# Patient Record
Sex: Male | Born: 1985 | Race: White | Hispanic: No | Marital: Single | Smoking: Former smoker
Health system: Southern US, Community
[De-identification: ages and names within clinical notes are randomized; demographics above are authoritative.]

## PROBLEM LIST (undated history)

## (undated) HISTORY — PX: HERNIA REPAIR: SHX51

---
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
## 2018-07-09 ENCOUNTER — Other Ambulatory Visit: Payer: Self-pay

## 2018-07-09 ENCOUNTER — Emergency Department
Admission: EM | Admit: 2018-07-09 | Discharge: 2018-07-09 | Disposition: A | Discharge status: Home / Self Care | Payer: Self-pay | Attending: Emergency Medicine | Admitting: Emergency Medicine

## 2018-07-09 ENCOUNTER — Emergency Department: Payer: Self-pay

## 2018-07-09 ENCOUNTER — Encounter: Payer: Self-pay | Admitting: Emergency Medicine

## 2018-07-09 DIAGNOSIS — F17210 Nicotine dependence, cigarettes, uncomplicated: Secondary | ICD-10-CM | POA: Insufficient documentation

## 2018-07-09 DIAGNOSIS — J40 Bronchitis, not specified as acute or chronic: Secondary | ICD-10-CM | POA: Insufficient documentation

## 2018-07-09 MED ORDER — PSEUDOEPH-BROMPHEN-DM 30-2-10 MG/5ML PO SYRP: 10.0000 mL | ORAL_SOLUTION | Freq: Four times a day (QID) | ORAL | 0 refills | Status: DC | PRN

## 2018-07-09 MED ORDER — PREDNISONE 50 MG PO TABS: 50.0000 mg | ORAL_TABLET | Freq: Every day | ORAL | 0 refills | Status: DC

## 2018-07-09 MED ORDER — ALBUTEROL SULFATE HFA 108 (90 BASE) MCG/ACT IN AERS: 2.0000 | INHALATION_SPRAY | RESPIRATORY_TRACT | 0 refills | Status: DC | PRN

## 2018-07-09 NOTE — ED Provider Notes (Signed)
[facility] Emergency Department Provider Note  ____________________________________________  Time seen: Approximately 3:48 PM  I have reviewed the triage vital signs and the nursing notes.   HISTORY  Chief Complaint No chief complaint on file.    HPI Allen Durham is a 32 y.o. male who presents the emergency department complaining of shortness of breath, cough,  "rattling" in his chest with breathing.  Patient reports that he has had intermittent fevers, pain with chills.  Patient reports his symptoms are worse at night.  He denies any nasal congestion, ear pain, sore throat, headache, neck stiffness, chest pain, dental pain, nausea vomiting.  No history of asthma.  No medications for his complaint prior to arrival.   History reviewed. No pertinent past medical history.  There are no active problems to display for this patient.   Past Surgical History:  Procedure Laterality Date  . HERNIA REPAIR      Prior to Admission medications   Medication Sig Start Date End Date Taking? Authorizing Provider  albuterol (PROVENTIL HFA;VENTOLIN HFA) 108 (90 Base) MCG/ACT inhaler Inhale 2 puffs into the lungs every 4 (four) hours as needed for wheezing or shortness of breath. 07/09/18   Mattalynn Crandle, Delorise RoyalsJonathan D, PA-C  brompheniramine-pseudoephedrine-DM 30-2-10 MG/5ML syrup Take 10 mLs by mouth 4 (four) times daily as needed. 07/09/18   Cinthya Bors, Delorise RoyalsJonathan D, PA-C  predniSONE (DELTASONE) 50 MG tablet Take 1 tablet (50 mg total) by mouth daily with breakfast. 07/09/18   Wealthy Danielski, Delorise RoyalsJonathan D, PA-C    Allergies Amoxapine and related  No family history on file.  Social History Social History   Tobacco Use  . Smoking status: Current Every Day Smoker    Types: Cigarettes  . Smokeless tobacco: Never Used  Substance Use Topics  . Alcohol use: Not on file  . Drug use: Not on file     Review of Systems  Constitutional: No fever/chills Eyes: No visual changes.  ENT:  No upper respiratory complaints. Cardiovascular: no chest pain. Respiratory: Positive cough. No SOB.  Positive for "rattling" when breathing Gastrointestinal: No abdominal pain.  No nausea, no vomiting.  No diarrhea.  No constipation. Musculoskeletal: Negative for musculoskeletal pain. Skin: Negative for rash, abrasions, lacerations, ecchymosis. Neurological: Negative for headaches, focal weakness or numbness. 10-point ROS otherwise negative.  ____________________________________________   PHYSICAL EXAM:  VITAL SIGNS: ED Triage Vitals  Enc Vitals Group     BP 07/09/18 1358 129/82     Pulse Rate 07/09/18 1358 (!) 104     Resp 07/09/18 1358 (!) 23     Temp 07/09/18 1358 98.5 F (36.9 C)     Temp Source 07/09/18 1358 Oral     SpO2 07/09/18 1358 99 %     Weight 07/09/18 1359 220 lb (99.8 kg)     Height 07/09/18 1359 6\' 1"  (1.854 m)     Head Circumference --      Peak Flow --      Pain Score 07/09/18 1400 8     Pain Loc --      Pain Edu? --      Excl. in [place]? --      Constitutional: Alert and oriented. Well appearing and in no acute distress. Eyes: Conjunctivae are normal. PERRL. EOMI. Head: Atraumatic. ENT:      Ears: EACs and TMs unremarkable bilaterally      Nose: No congestion/rhinnorhea.      Mouth/Throat: Mucous membranes are moist.  Oropharynx is minimally erythematous.  Not edematous. Neck: No stridor.  Neck is supple full range of motion Hematological/Lymphatic/Immunilogical: No cervical lymphadenopathy. Cardiovascular: Normal rate, regular rhythm. Normal S1 and S2.  Good peripheral circulation. Respiratory: Normal respiratory effort without tachypnea or retractions. Lungs with rhonchi bilaterally, worse on the right than left.  Adventitious lung sounds are appreciated in bilateral lower lung fields.Peri [place]. Good air entry to the bases with no decreased or absent breath sounds. Musculoskeletal: Full range of motion to all extremities. No gross deformities  appreciated. Neurologic:  Normal speech and language. No gross focal neurologic deficits are appreciated.  Skin:  Skin is warm, dry and intact. No rash noted. Psychiatric: Mood and affect are normal. Speech and behavior are normal. Patient exhibits appropriate insight and judgement.   ____________________________________________   LABS (all labs ordered are listed, but only abnormal results are displayed)  Labs Reviewed - No data to display ____________________________________________  EKG   ____________________________________________  RADIOLOGY I personally viewed and evaluated these images as part of my medical decision making, as well as reviewing the written report by the radiologist.  I concur with radiologist finding of no consolidation concerning for pneumonia.  Dg Chest 2 View  Result Date: 07/09/2018 CLINICAL DATA:  Wheezing and chills for the past 3 days. EXAM: CHEST - 2 VIEW COMPARISON:  Chest x-ray dated August 19, 2006. FINDINGS: The heart size and mediastinal contours are within normal limits. Both lungs are clear. The visualized skeletal structures are unremarkable. IMPRESSION: No active cardiopulmonary disease. Electronically Signed   By: Obie DredgeWilliam T Derry M.D.   On: 07/09/2018 16:05    ____________________________________________    PROCEDURES  Procedure(s) performed:    Procedures    Medications - No data to display   ____________________________________________   INITIAL IMPRESSION / ASSESSMENT AND PLAN / ED COURSE  Pertinent labs & imaging results that were available during my care of the patient were reviewed by me and considered in my medical decision making (see chart for details).  Review of the [place] CSRS was performed in accordance of the [facility] prior to dispensing any controlled drugs.      Patient's diagnosis is consistent with bronchitis.  Patient presents emergency department with several day history of wheeze, "rattling", cough.  On  exam, mild rhonchi bilateral lower lung fields.  Chest x-ray reveals no consolidation concerning for pneumonia.  Differential pharyngeal includes viral URI, bronchitis, pneumonia.  Patient will be prescribed albuterol, prednisone, cough medication.  Follow-up with primary care as needed..  Patient is given ED precautions to return to the ED for any worsening or new symptoms.     ____________________________________________  FINAL CLINICAL IMPRESSION(S) / ED DIAGNOSES  Final diagnoses:  Bronchitis      NEW MEDICATIONS STARTED DURING THIS VISIT:  ED Discharge Orders         Ordered    albuterol (PROVENTIL HFA;VENTOLIN HFA) 108 (90 Base) MCG/ACT inhaler  Every 4 hours PRN     07/09/18 1624    predniSONE (DELTASONE) 50 MG tablet  Daily with breakfast     07/09/18 1624    brompheniramine-pseudoephedrine-DM 30-2-10 MG/5ML syrup  4 times daily PRN     07/09/18 1624              This chart was dictated using voice recognition software/Dragon. Despite best efforts to proofread, errors can occur which can change the meaning. Any change was purely unintentional.    Racheal PatchesCuthriell, Krisalyn Yankowski D, PA-C 07/09/18 1625    Sharman CheekStafford, Phillip, MD 07/20/18 [phone]

## 2018-07-09 NOTE — ED Notes (Signed)
See triage note  Presents with cough and occasional wheezing for the past couple of days  Unsure of fever but has had hot flashes and chills  Afebrile on arrival

## 2018-07-09 NOTE — ED Triage Notes (Signed)
C/O wheezing, chills x 3 days.  Has not taken any medication for symptoms.

## 2019-01-26 ENCOUNTER — Emergency Department: Payer: Self-pay

## 2019-01-26 ENCOUNTER — Encounter: Payer: Self-pay | Admitting: Emergency Medicine

## 2019-01-26 ENCOUNTER — Other Ambulatory Visit: Payer: Self-pay

## 2019-01-26 ENCOUNTER — Emergency Department
Admission: EM | Admit: 2019-01-26 | Discharge: 2019-01-26 | Disposition: A | Discharge status: Home / Self Care | Payer: Self-pay | Attending: Emergency Medicine | Admitting: Emergency Medicine

## 2019-01-26 DIAGNOSIS — Y999 Unspecified external cause status: Secondary | ICD-10-CM | POA: Insufficient documentation

## 2019-01-26 DIAGNOSIS — S62317A Displaced fracture of base of fifth metacarpal bone. left hand, initial encounter for closed fracture: Secondary | ICD-10-CM | POA: Insufficient documentation

## 2019-01-26 DIAGNOSIS — W010XXA Fall on same level from slipping, tripping and stumbling without subsequent striking against object, initial encounter: Secondary | ICD-10-CM | POA: Insufficient documentation

## 2019-01-26 DIAGNOSIS — F17210 Nicotine dependence, cigarettes, uncomplicated: Secondary | ICD-10-CM | POA: Insufficient documentation

## 2019-01-26 DIAGNOSIS — Y939 Activity, unspecified: Secondary | ICD-10-CM | POA: Insufficient documentation

## 2019-01-26 DIAGNOSIS — Y92017 Garden or yard in single-family (private) house as the place of occurrence of the external cause: Secondary | ICD-10-CM | POA: Insufficient documentation

## 2019-01-26 NOTE — ED Triage Notes (Signed)
Tripped and fell today while doing yard work, c/o left hand pain

## 2019-01-26 NOTE — Discharge Instructions (Signed)
Your x-ray shows that you have a fracture to the fifth bone in your hand.  Please wear splint and use sling.  Please follow-up with hand surgery or orthopedics in the next week for reevaluation.  Ice and elevate hand tonight.  You can take Motrin for pain and swelling.

## 2019-01-26 NOTE — ED Provider Notes (Signed)
[facility] Emergency Department Provider Note  ____________________________________________  Time seen: Approximately 6:38 PM  I have reviewed the triage vital signs and the nursing notes.   HISTORY  Chief Complaint Hand Injury    HPI Allen Durham is a 33 y.o. male presents emergency department for evaluation of left hand pain after injury today.  Patient states that he was doing yard work, getting near ready for his son's birthday party when he tripped over the dog and his hand hit a wall.  No additional injuries.  Patient is left-handed.  No numbness, tingling.  History reviewed. No pertinent past medical history.  There are no active problems to display for this patient.   Past Surgical History:  Procedure Laterality Date  . HERNIA REPAIR      Prior to Admission medications   Not on File    Allergies Amoxapine and related  No family history on file.  Social History Social History   Tobacco Use  . Smoking status: Current Every Day Smoker    Types: Cigarettes  . Smokeless tobacco: Never Used  Substance Use Topics  . Alcohol use: Not on file  . Drug use: Not on file     Review of Systems  Respiratory:No SOB. Gastrointestinal: No abdominal pain.  No nausea, no vomiting.  Musculoskeletal: Positive for hand pain. Skin: Negative for rash, abrasions, lacerations, ecchymosis. Neurological: Negative for numbness or tingling   ____________________________________________   PHYSICAL EXAM:  VITAL SIGNS: ED Triage Vitals  Enc Vitals Group     BP 01/26/19 1748 125/82     Pulse Rate 01/26/19 1748 90     Resp 01/26/19 1748 16     Temp 01/26/19 1748 98.3 F (36.8 C)     Temp Source 01/26/19 1748 Oral     SpO2 01/26/19 1748 100 %     Weight 01/26/19 1744 220 lb 0.3 oz (99.8 kg)     Height --      Head Circumference --      Peak Flow --      Pain Score 01/26/19 1744 7     Pain Loc --      Pain Edu? --      Excl. in [place]? --       Constitutional: Alert and oriented. Well appearing and in no acute distress. Eyes: Conjunctivae are normal. PERRL. EOMI. Head: Atraumatic. ENT:      Ears:      Nose: No congestion/rhinnorhea.      Mouth/Throat: Mucous membranes are moist.  Neck: No stridor. Cardiovascular: Normal rate, regular rhythm.  Good peripheral circulation.  Symmetric radial pulses bilaterally.  Cap refill less than 3 seconds. Respiratory: Normal respiratory effort without tachypnea or retractions. Lungs CTAB. Good air entry to the bases with no decreased or absent breath sounds. Musculoskeletal: Full range of motion to all extremities. No gross deformities appreciated.  Tenderness to palpation with mild swelling to left fifth metacarpal.  Full range of motion of hand. Neurologic:  Normal speech and language. No gross focal neurologic deficits are appreciated.  Skin:  Skin is warm, dry and intact. No rash noted. Psychiatric: Mood and affect are normal. Speech and behavior are normal. Patient exhibits appropriate insight and judgement.   ____________________________________________   LABS (all labs ordered are listed, but only abnormal results are displayed)  Labs Reviewed - No data to display ____________________________________________  EKG   ____________________________________________  RADIOLOGY Robinette Haines, personally viewed and evaluated these images (plain radiographs) as part of  my medical decision making, as well as reviewing the written report by the radiologist.  Dg Hand Complete Left  Result Date: 01/26/2019 CLINICAL DATA:  Left hand pain in the region of the 5th metacarpal following a fall today. EXAM: LEFT HAND - COMPLETE 3+ VIEW COMPARISON:  None. FINDINGS: Fracture of the base of the 5th metacarpal with mild dorsal displacement of the distal fragment without significant angulation. Associated soft tissue swelling. IMPRESSION: Fracture of the base of the 5th metacarpal.  Electronically Signed   By: Beckie SaltsSteven  Reid M.D.   On: 01/26/2019 18:25    ____________________________________________    PROCEDURES  Procedure(s) performed:    Procedures    Medications - No data to display   ____________________________________________   INITIAL IMPRESSION / ASSESSMENT AND PLAN / ED COURSE  Pertinent labs & imaging results that were available during my care of the patient were reviewed by me and considered in my medical decision making (see chart for details).  Review of the [place] CSRS was performed in accordance of the [facility] prior to dispensing any controlled drugs.     Patient's diagnosis is consistent with fifth metacarpal fracture.  Vital signs and exam are reassuring.  X-ray consistent with fracture.  Ulnar gutter splint was placed.  Sling was given.  Patient will take Motrin for pain and swelling.  He declines prescription.  Patient is to follow up with Ortho or hand specialist as directed. Patient is given ED precautions to return to the ED for any worsening or new symptoms.   Allen RaidJesse Andrada was evaluated in Emergency Department on 01/26/2019 for the symptoms described in the history of present illness. He was evaluated in the context of the global COVID-19 pandemic, which necessitated consideration that the patient might be at risk for infection with the SARS-CoV-2 virus that causes COVID-19. Institutional protocols and algorithms that pertain to the evaluation of patients at risk for COVID-19 are in a state of rapid change based on information released by regulatory bodies including the CDC and federal and state organizations. These policies and algorithms were followed during the patient's care in the ED.  ____________________________________________  FINAL CLINICAL IMPRESSION(S) / ED DIAGNOSES  Final diagnoses:  Closed displaced fracture of base of fifth metacarpal bone of left hand, initial encounter      NEW MEDICATIONS STARTED DURING THIS  VISIT:  ED Discharge Orders    None          This chart was dictated using voice recognition software/Dragon. Despite best efforts to proofread, errors can occur which can change the meaning. Any change was purely unintentional.    Enid DerryWagner, Katrinka Herbison, PA-C 01/26/19 1853    Arnaldo NatalMalinda, Paul F, MD 01/26/19 2015

## 2019-01-26 NOTE — ED Notes (Signed)
See triage note  States he tripped over the dog  Hit a wall  Pain and swelling noted to left hand

## 2020-03-10 IMAGING — CR DG CHEST 2V
1 series · 2 of 2 positions shown · non-contrast
Comparison: Chest x-ray dated August 19, 2006.

CLINICAL DATA: Wheezing and chills for the past 3 days.

EXAM:
CHEST - 2 VIEW

[Series 1: dg chest 2 view · 0.14mm/px · 2 of 2 slices shown]
[im 1/2]
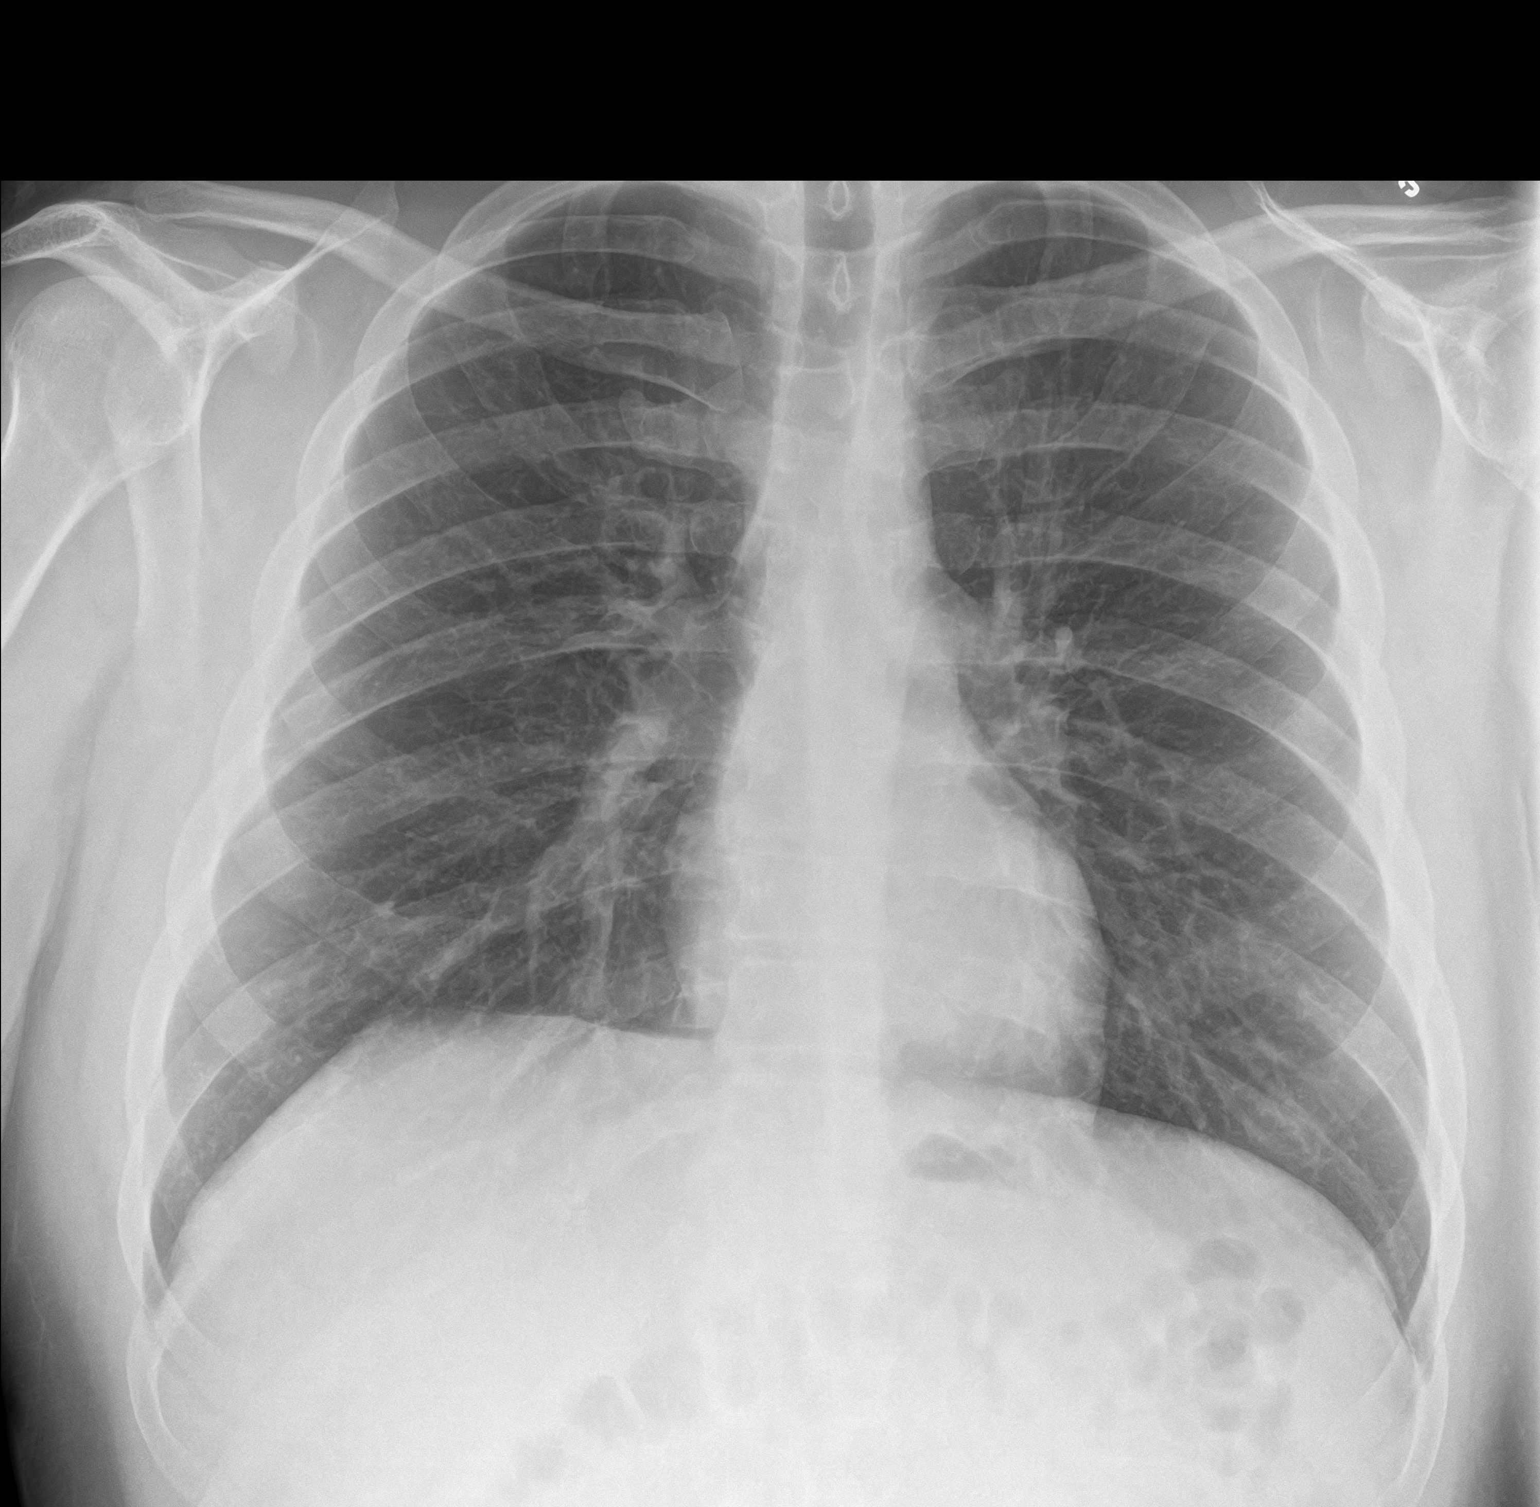
[im 2/2]
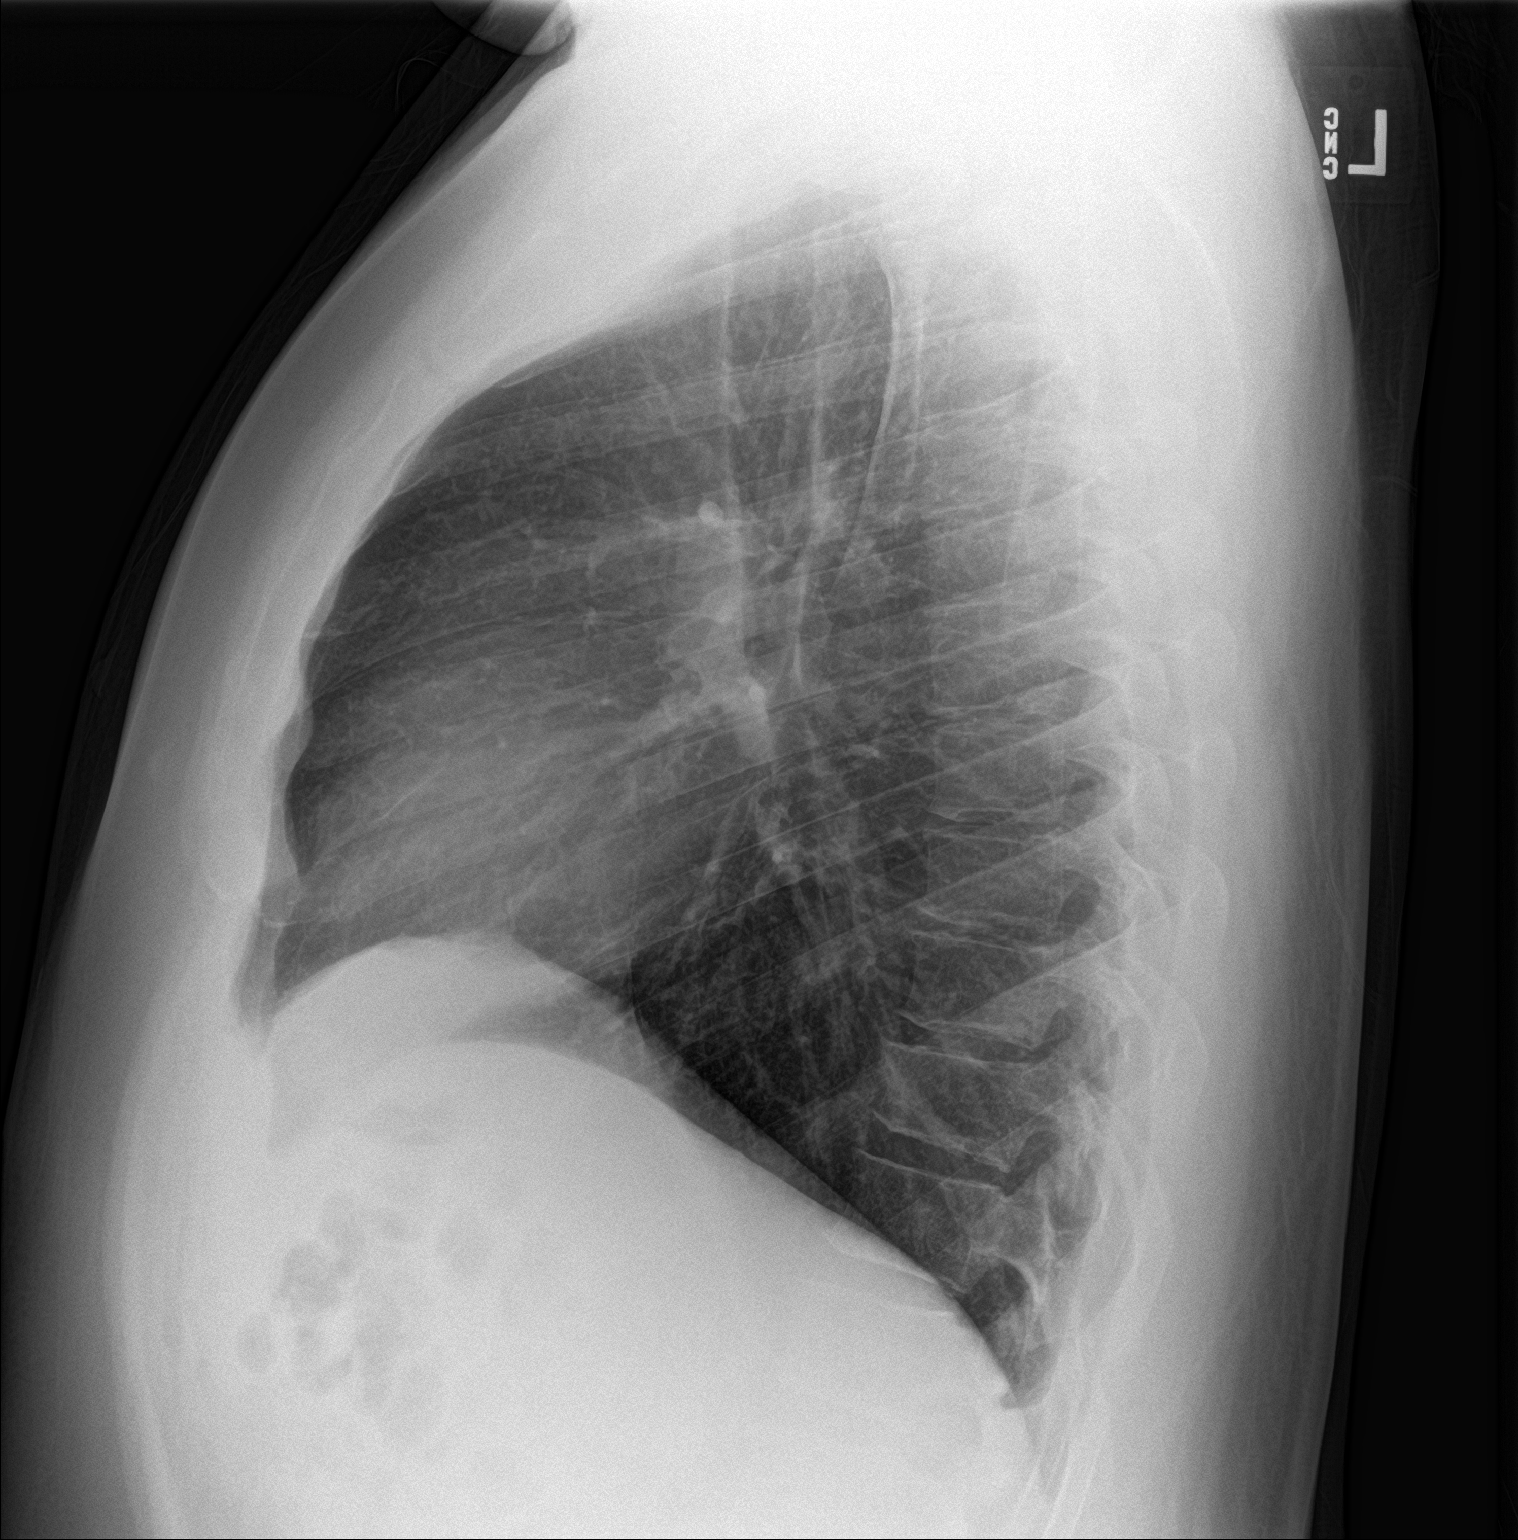

[2 of 2 positions shown; findings below may reference images not displayed]

FINDINGS: The heart size and mediastinal contours are within normal limits.
Both lungs are clear. The visualized skeletal structures are
unremarkable.
IMPRESSION: No active cardiopulmonary disease.

## 2020-09-27 IMAGING — CR LEFT HAND - COMPLETE 3+ VIEW
3 series · 3 of 3 positions shown · non-contrast
Comparison: None.

CLINICAL DATA: Left hand pain in the region of the 5th metacarpal
following a fall today.

EXAM:
LEFT HAND - COMPLETE 3+ VIEW

[hand ap]
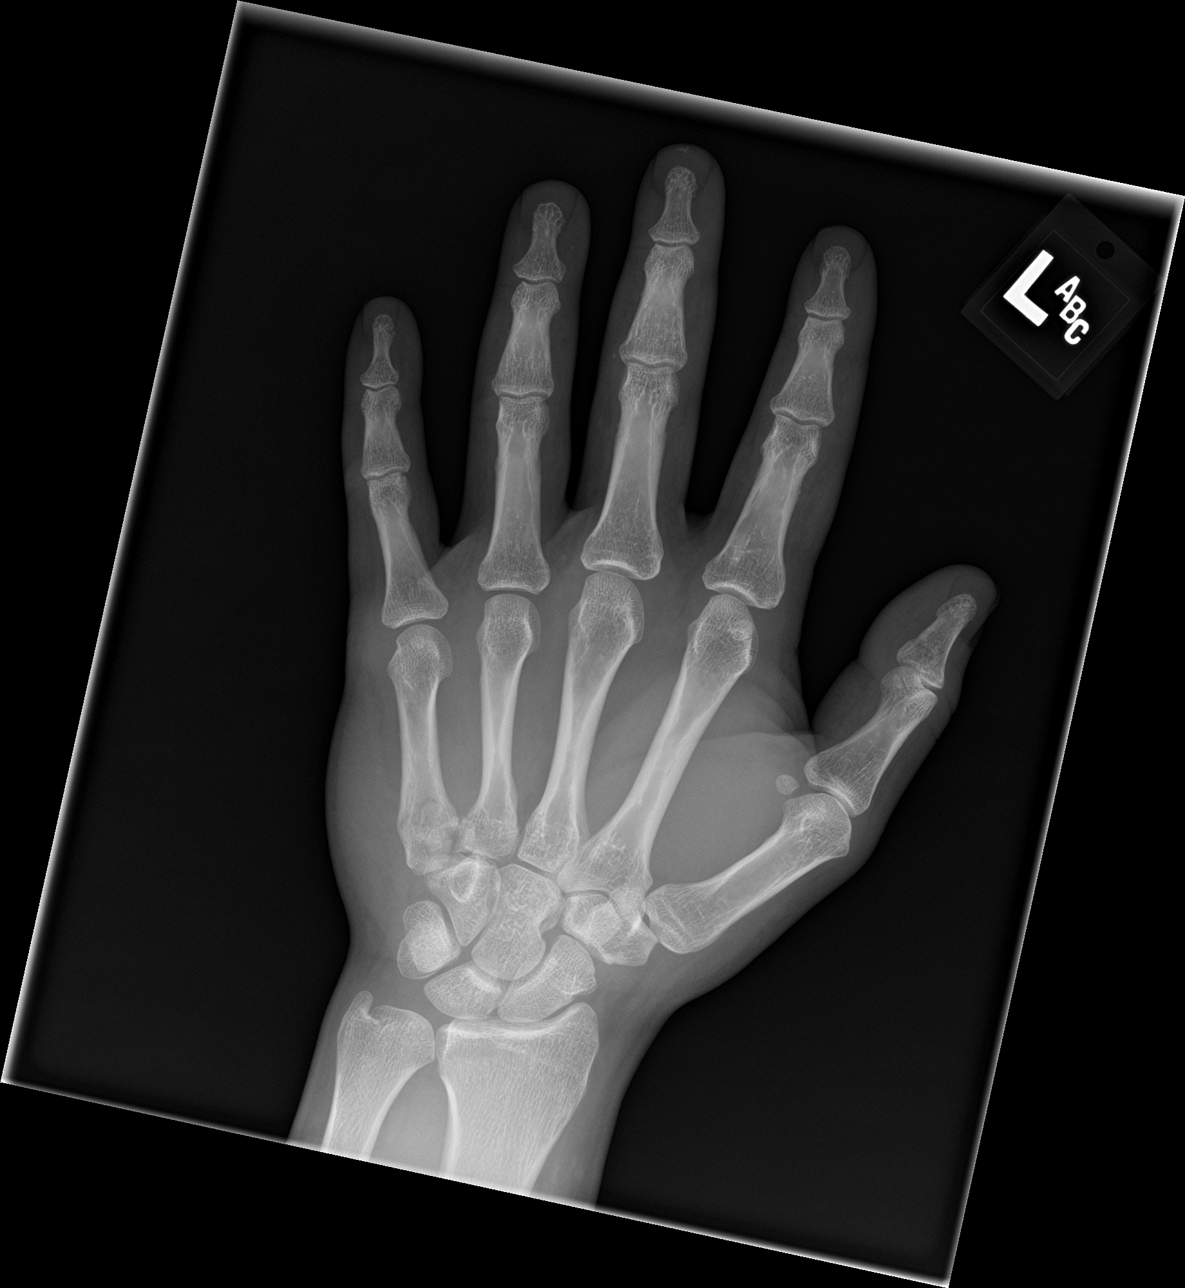

[hand obl]
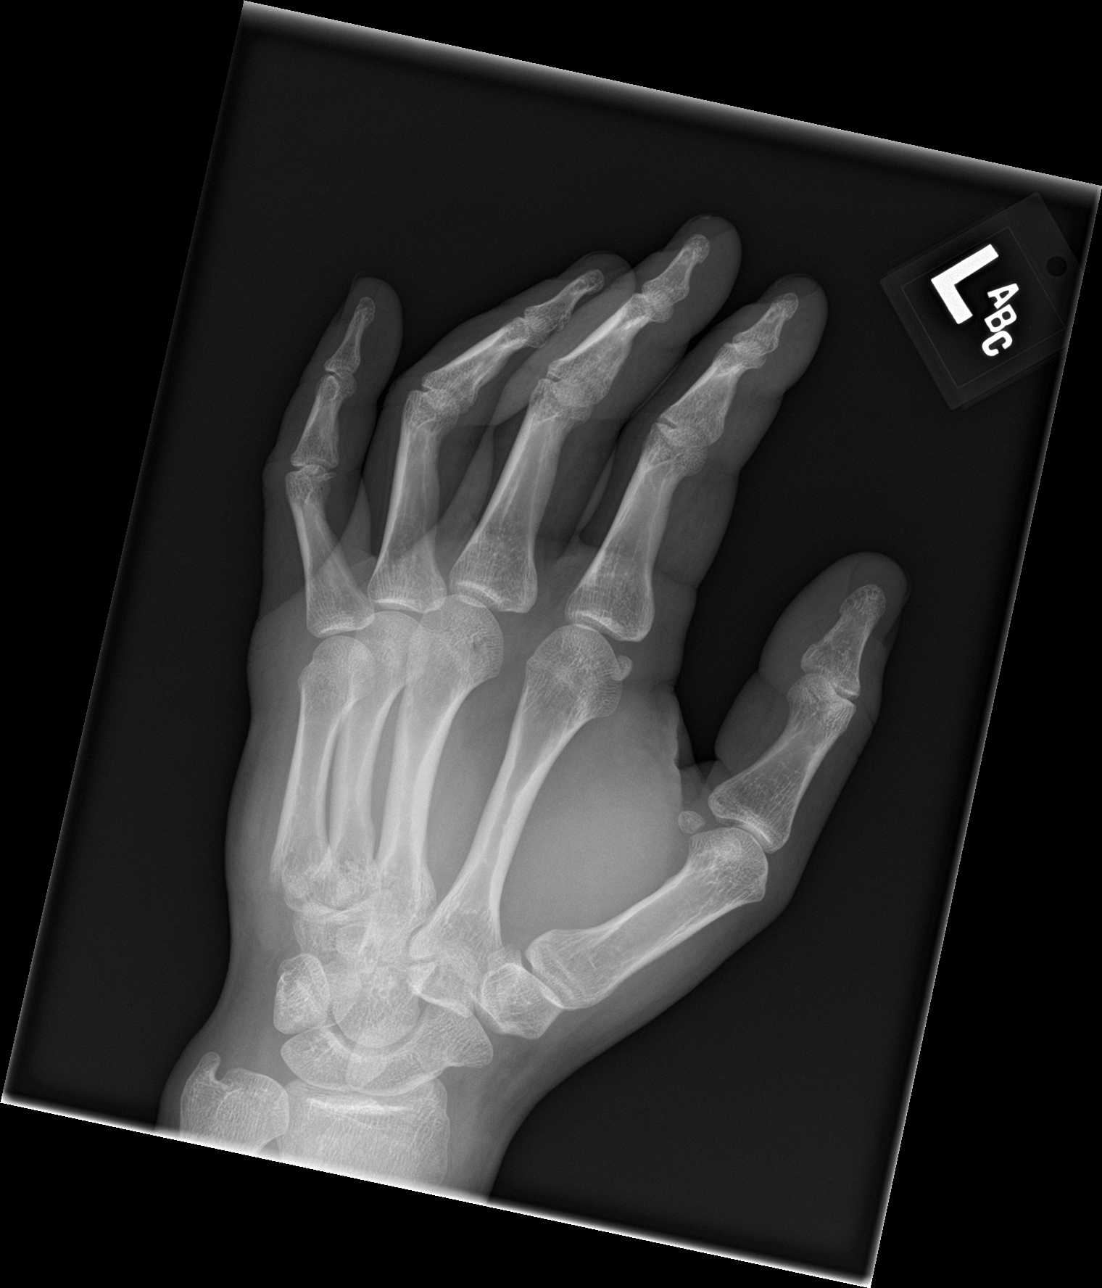

[hand lat]
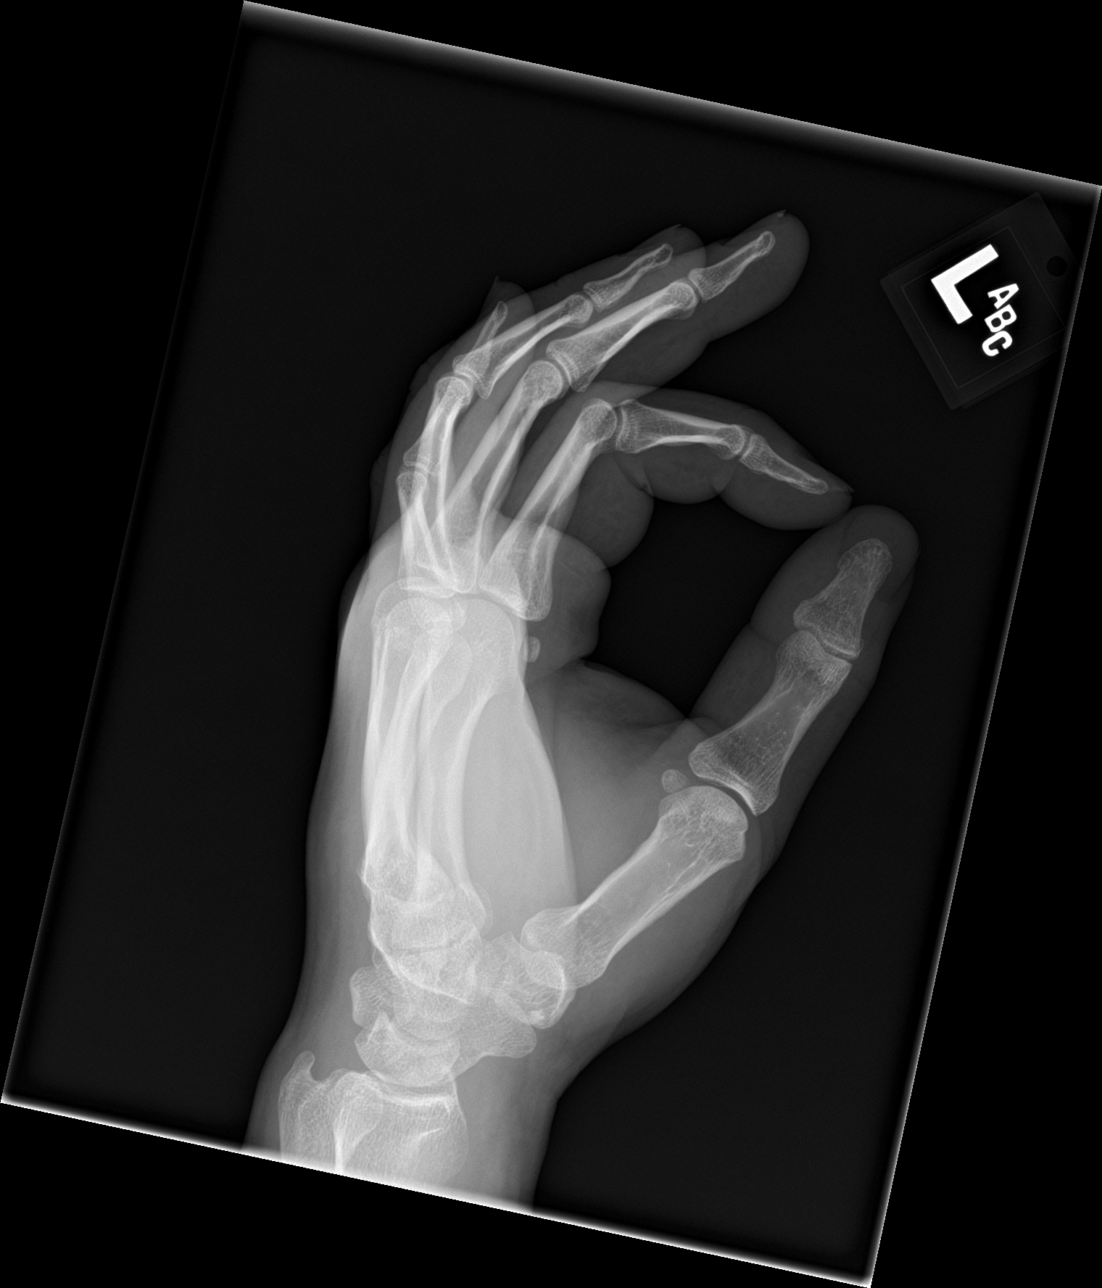

[3 of 3 positions shown; findings below may reference images not displayed]

FINDINGS: Fracture of the base of the 5th metacarpal with mild dorsal
displacement of the distal fragment without significant angulation.
Associated soft tissue swelling.
IMPRESSION: Fracture of the base of the 5th metacarpal.

## 2022-11-18 ENCOUNTER — Encounter (INDEPENDENT_AMBULATORY_CARE_PROVIDER_SITE_OTHER): Payer: PRIVATE HEALTH INSURANCE | Admitting: *Deleted

## 2022-11-18 DIAGNOSIS — K4090 Unilateral inguinal hernia, without obstruction or gangrene, not specified as recurrent: Secondary | ICD-10-CM

## 2022-11-18 NOTE — Progress Notes (Signed)
This encounter was created in error - please disregard.

## 2022-11-19 ENCOUNTER — Ambulatory Visit (INDEPENDENT_AMBULATORY_CARE_PROVIDER_SITE_OTHER): Payer: PRIVATE HEALTH INSURANCE | Admitting: General Surgery

## 2022-11-19 ENCOUNTER — Encounter: Payer: Self-pay | Admitting: General Surgery

## 2022-11-19 VITALS — BP 119/76 | HR 75 | Temp 98.2°F | Resp 14 | Ht 73.0 in | Wt 235.0 lb

## 2022-11-19 DIAGNOSIS — R1031 Right lower quadrant pain: Secondary | ICD-10-CM | POA: Diagnosis not present

## 2022-11-19 MED ORDER — GABAPENTIN 300 MG PO CAPS: 300.0000 mg | ORAL_CAPSULE | Freq: Three times a day (TID) | ORAL | 1 refills | Status: AC

## 2022-11-20 NOTE — Progress Notes (Signed)
Allen Durham; [id number]; 1986-01-06   HPI Patient is a 37 year old white male who presents with worsening right groin pain.  As he has had a right inguinal herniorrhaphy with mesh in 2006, he was concerned that the hernia had recurred.  The pain is primarily centered in the right inner thigh and right testicle.  Sometimes the pain radiates from the testicle to the right groin.  He has not noticed a lump.  It is made worse with straining.  This occurred suddenly approximately 4 to 6 weeks ago.  It is intermittent in nature.  He has taken some ibuprofen with minimal relief. History reviewed. No pertinent past medical history.  Past Surgical History:  Procedure Laterality Date   HERNIA REPAIR      History reviewed. No pertinent family history.  No current outpatient medications on file prior to visit.   No current facility-administered medications on file prior to visit.    Allergies  Allergen Reactions   Amoxicillin Other (See Comments)    Reaction as a child - does not know what the reaction was    Social History   Substance and Sexual Activity  Alcohol Use Yes   Comment: Rarely    Social History   Tobacco Use  Smoking Status Former   Types: Cigarettes   Quit date: 11/01/2022   Years since quitting: 0.0  Smokeless Tobacco Never    Review of Systems  Constitutional: Negative.   HENT: Negative.    Eyes: Negative.   Respiratory: Negative.    Cardiovascular: Negative.   Gastrointestinal: Negative.   Genitourinary: Negative.   Musculoskeletal: Negative.   Skin: Negative.   Neurological: Negative.   Endo/Heme/Allergies: Negative.   Psychiatric/Behavioral: Negative.      Objective   Vitals:   11/19/22 1419  BP: 119/76  Pulse: 75  Resp: 14  Temp: 98.2 F (36.8 C)  SpO2: 95%    Physical Exam Vitals reviewed.  Constitutional:      Appearance: Normal appearance. He is not ill-appearing.  HENT:     Head: Normocephalic and atraumatic.  Cardiovascular:      Rate and Rhythm: Normal rate and regular rhythm.     Heart sounds: Normal heart sounds. No murmur heard.    No friction rub. No gallop.  Pulmonary:     Effort: Pulmonary effort is normal. No respiratory distress.     Breath sounds: Normal breath sounds. No stridor. No wheezing, rhonchi or rales.  Abdominal:     General: Bowel sounds are normal. There is no distension.     Palpations: Abdomen is soft. There is no mass.     Tenderness: There is no abdominal tenderness. There is no guarding or rebound.     Hernia: No hernia is present.     Comments: No discrete right inguinal hernia noted.  Some discomfort to palpation over the internal ring.  This was intermittent in nature.  Genitourinary:    Testes: Normal.     Comments: No epididymal pain noted.  No hydrocele present. Skin:    General: Skin is warm and dry.  Neurological:     Mental Status: He is alert and oriented to person, place, and time.     Assessment  Right groin pain of unknown etiology.  At this present time, I do not feel he has recurrent right inguinal hernia.  In addition, to have a right inguinal neuralgia this far out from the surgery is rare.  This may be all related to a muscular strain.  This  may be affecting the right ilioinguinal nerve. Plan  Ibuprofen for pain and Neurontin 300 mg p.o. 3 times daily for 1 month.  Patient was told to watch for any mental acuity changes.  He will follow-up here in 1 month should the pain persist or worsen.  He understands and agrees with treatment plan.  Follow-up here expectantly.

## 2023-09-22 ENCOUNTER — Ambulatory Visit: Payer: PRIVATE HEALTH INSURANCE | Admitting: Internal Medicine

## 2023-09-29 ENCOUNTER — Encounter: Payer: Self-pay | Admitting: Internal Medicine

## 2024-10-14 ENCOUNTER — Ambulatory Visit: Admitting: Physician Assistant
